# Patient Record
Sex: Female | Born: 1980 | Race: Black or African American | Hispanic: No | Marital: Single | Smoking: Former smoker
Health system: Southern US, Community
[De-identification: ages and names within clinical notes are randomized; demographics above are authoritative.]

## PROBLEM LIST (undated history)

## (undated) DIAGNOSIS — R55 Syncope and collapse: Secondary | ICD-10-CM

---

## 2014-03-30 ENCOUNTER — Emergency Department (HOSPITAL_COMMUNITY)
Admission: EM | Admit: 2014-03-30 | Discharge: 2014-03-30 | Disposition: A | Discharge status: Home / Self Care | Payer: Self-pay | Attending: Emergency Medicine | Admitting: Emergency Medicine

## 2014-03-30 ENCOUNTER — Encounter (HOSPITAL_COMMUNITY): Payer: Self-pay | Admitting: Emergency Medicine

## 2014-03-30 ENCOUNTER — Emergency Department (HOSPITAL_COMMUNITY): Payer: Self-pay

## 2014-03-30 DIAGNOSIS — R404 Transient alteration of awareness: Secondary | ICD-10-CM | POA: Insufficient documentation

## 2014-03-30 DIAGNOSIS — Z3202 Encounter for pregnancy test, result negative: Secondary | ICD-10-CM | POA: Insufficient documentation

## 2014-03-30 DIAGNOSIS — Z87891 Personal history of nicotine dependence: Secondary | ICD-10-CM | POA: Insufficient documentation

## 2014-03-30 DIAGNOSIS — R55 Syncope and collapse: Secondary | ICD-10-CM | POA: Insufficient documentation

## 2014-03-30 DIAGNOSIS — Z23 Encounter for immunization: Secondary | ICD-10-CM | POA: Insufficient documentation

## 2014-03-30 DIAGNOSIS — N390 Urinary tract infection, site not specified: Secondary | ICD-10-CM | POA: Insufficient documentation

## 2014-03-30 HISTORY — DX: Syncope and collapse: R55

## 2014-03-30 LAB — D-DIMER, QUANTITATIVE (NOT AT ARMC): D DIMER QUANT: 0.44 ug{FEU}/mL (ref 0.00–0.48)

## 2014-03-30 LAB — URINALYSIS, ROUTINE W REFLEX MICROSCOPIC
BILIRUBIN URINE: NEGATIVE
Glucose, UA: NEGATIVE mg/dL
Ketones, ur: NEGATIVE mg/dL
NITRITE: NEGATIVE
PH: 6 (ref 5.0–8.0)
Protein, ur: NEGATIVE mg/dL
SPECIFIC GRAVITY, URINE: 1.008 (ref 1.005–1.030)
UROBILINOGEN UA: 1 mg/dL (ref 0.0–1.0)

## 2014-03-30 LAB — I-STAT CHEM 8, ED
BUN: 9 mg/dL (ref 6–23)
CHLORIDE: 103 meq/L (ref 96–112)
Calcium, Ion: 1.12 mmol/L (ref 1.12–1.23)
Creatinine, Ser: 0.8 mg/dL (ref 0.50–1.10)
Glucose, Bld: 114 mg/dL — ABNORMAL HIGH (ref 70–99)
HEMATOCRIT: 36 % (ref 36.0–46.0)
Hemoglobin: 12.2 g/dL (ref 12.0–15.0)
POTASSIUM: 3.2 meq/L — AB (ref 3.7–5.3)
SODIUM: 137 meq/L (ref 137–147)
TCO2: 22 mmol/L (ref 0–100)

## 2014-03-30 LAB — URINE MICROSCOPIC-ADD ON

## 2014-03-30 LAB — TROPONIN I

## 2014-03-30 LAB — CBG MONITORING, ED: GLUCOSE-CAPILLARY: 92 mg/dL (ref 70–99)

## 2014-03-30 LAB — PREGNANCY, URINE: Preg Test, Ur: NEGATIVE

## 2014-03-30 MED ORDER — TETANUS-DIPHTH-ACELL PERTUSSIS 5-2.5-18.5 LF-MCG/0.5 IM SUSP
0.5000 mL | Freq: Once | INTRAMUSCULAR | Status: AC
  Administered 2014-03-30: 0.5 mL via INTRAMUSCULAR
  Filled 2014-03-30: qty 0.5

## 2014-03-30 MED ORDER — CEPHALEXIN 500 MG PO CAPS: 500.0000 mg | ORAL_CAPSULE | Freq: Four times a day (QID) | ORAL | Status: AC

## 2014-03-30 MED ORDER — SODIUM CHLORIDE 0.9 % IV BOLUS (SEPSIS)
1000.0000 mL | Freq: Once | INTRAVENOUS | Status: AC
  Administered 2014-03-30: 1000 mL via INTRAVENOUS

## 2014-03-30 MED ORDER — POTASSIUM CHLORIDE 20 MEQ/15ML (10%) PO LIQD
40.0000 meq | Freq: Once | ORAL | Status: AC
  Administered 2014-03-30: 40 meq via ORAL
  Filled 2014-03-30: qty 30

## 2014-03-30 MED ORDER — SODIUM CHLORIDE 0.9 % IV SOLN
INTRAVENOUS | Status: DC
  Administered 2014-03-30: 08:00:00 via INTRAVENOUS

## 2014-03-30 NOTE — Discharge Instructions (Signed)
°Emergency Department Resource Guide °1) Find a Doctor and Pay Out of Pocket °Although you won't have to find out who is covered by your insurance plan, it is a good idea to ask around and get recommendations. You will then need to call the office and see if the doctor you have chosen will accept you as a new patient and what types of options they offer for patients who are self-pay. Some doctors offer discounts or will set up payment plans for their patients who do not have insurance, but you will need to ask so you aren't surprised when you get to your appointment. ° °2) Contact Your Local Health Department °Not all health departments have doctors that can see patients for sick visits, but many do, so it is worth a call to see if yours does. If you don't know where your local health department is, you can check in your phone book. The CDC also has a tool to help you locate your state's health department, and many state websites also have listings of all of their local health departments. ° °3) Find a Walk-in Clinic °If your illness is not likely to be very severe or complicated, you may want to try a walk in clinic. These are popping up all over the country in pharmacies, drugstores, and shopping centers. They're usually staffed by nurse practitioners or physician assistants that have been trained to treat common illnesses and complaints. They're usually fairly quick and inexpensive. However, if you have serious medical issues or chronic medical problems, these are probably not your best option. ° °No Primary Care Doctor: °- Call [facility] at  832-8000 - they can help you locate a primary care doctor that  accepts your insurance, provides certain services, etc. °- Physician Referral Service- 1-800-533-3463 ° °Chronic Pain Problems: °Organization         Address  Phone   Notes  °[facility]  (336) [phone] Patients need to be referred by their primary care doctor.  ° °Medication  Assistance: °Organization         Address  Phone   Notes  °[place] [facility] [address] °[place], [place] [address] (336) 641-8030 --Must be a resident of [place] °-- Must have NO insurance coverage whatsoever (no Medicaid/ Medicare, etc.) °-- The pt. MUST have a primary care doctor that directs their care regularly and follows them in the community °  °[facility]  (866) 331-1348   °[employer]  (888) 892-1162   ° °Agencies that provide inexpensive medical care: °Organization         Address  Phone   Notes  °[facility]  (336) 832-8035   °[facility]    (336) 832-7272   °[facility] [address] °[place], [place] [address] (336) 832-4777   °[facility] [address], °[place] (336) 271-4999   °[facility]    (336) 373-0678   °[facility]    (336) 272-1050   °[facility] ° [address], [place] Phone:  (336) 832-4444, Fax:  (336) 832-4440 Hours of Operation:  9 am - 6 pm, M-F.  Also accepts Medicaid/Medicare and self-pay.  °[facility] ° [address], [place] Phone: (336) [phone], Fax: (336) [phone]. Hours of Operation:  8:30 am - 5:30 pm, M-F.  Also accepts Medicaid and self-pay.  °[facility] [place] [address], [place] Phone: (336) 878-6027   °[facility] [address], [place], [place] (336)723-1848, Ext. 123 Mondays & Thursdays: 7-9 AM.  First 15 patients are seen on a first come, first serve basis. °  ° °Medicaid-accepting [place] Providers: ° °Organization         Address  Phone   Notes  °[facility] [address], [place] (336) 641-2100 Also accepts self-pay patients.  °[facility] [address], [place] ° (336) 856-9996   °[facility] [address], [place]  (336) 288-8857   °[facility] [address] [place] [address], [place] (336) 299-7000   °Veita Bland [address], [place]  ° (336) 373-1557 Only accepts [place] Access Medicaid patients after they have their name applied to their card.  ° °Self-Pay (no insurance) in [place]: ° °Organization         Address  Phone   Notes  °Sickle Cell Patients, [facility] [address], [place] (336) 832-1970   °[facility] [address], [place] (336) 832-4400   °[facility] ° [address], [place] (336) 992-4800   °[facility]/Dr. Osei-Bonsu ° [address] [place] [address], [place] or [address], [place] (336) [phone] Phone number for both [place] and [place] locations is the same.  °[facility] [address], [place] (336) 299-0000   °[facility] [address] [place] [address], [place] or [address] (336) 852-7530 °(336) 878-2260   °[facility] [address], [place] (336) 350-1642, phone; (336) 294-5005, fax Sees patients 1st and 3rd Saturday of every month.  Must not qualify for public or private insurance (i.e. Medicaid, Medicare, [place] Health Choice, Veterans' Benefits) • Household income should be no more than 200% of the poverty level •The clinic cannot treat you if you are pregnant or think you are pregnant • Sexually transmitted diseases are not treated at the clinic.  ° ° °Dental Care: °Organization         Address  Phone  Notes  °[place] [facility] [address], [place] (336) 641-6152 Accepts children up to age 21 who are enrolled in Medicaid or [place] Health Choice; pregnant women with a Medicaid card; and children who have applied for Medicaid or [place] Health Choice, but were declined, whose parents can pay a reduced fee at time of service.  °[place]  [facility] [place]  [address], [place] (336) 641-7733 Accepts children up to age 21 who are enrolled in Medicaid or [place] Health Choice; pregnant women with a Medicaid card; and children who have applied for Medicaid or [place] Health Choice, but were declined, whose parents can pay a reduced fee at time of service.  °[facility] ° [address], [place] (336) 641-4533 Patients are seen by appointment only. Walk-ins are not accepted. [facility] will see patients 18 years of age and older. °Monday - Tuesday (8am-5pm) °Most Wednesdays (8:30-5pm) °$30 per visit, cash only  °[facility] ° [address], [place] (336) 641-4533 Patients are seen by appointment only. Walk-ins are not accepted. [facility] will see patients 18 years of age and older. °One   Wednesday Evening (Monthly: Volunteer Based).  $30 per visit, cash only  °[facility]  (919) [phone] for adults; Children under age 4, call Graduate Pediatric Dentistry at ([phone]) [phone]. Children aged 4-14, please call ([phone]) [phone] to request a pediatric application. ° Dental services are provided in all areas of dental care including fillings, crowns and bridges, complete and partial dentures, implants, gum treatment, root canals, and extractions. Preventive care is also provided. Treatment is provided to both adults and children. °Patients are selected via a lottery and there is often a waiting list. °  °[facility] [address], °[place] ° (336) 763-8833 [email or web address] °  °[facility] [address], [place], [place] (336)723-1848, Ext. 123 Second and Fourth Thursday of each month, opens at 6:30 AM; Clinic ends at 9 AM.  Patients are seen on a first-come first-served basis, and a limited number are seen during each clinic.  ° °[facility] ° [address], [place], [place] (336) 723-7904    Eligibility Requirements °You must have lived in [place], [place], or [place] counties for at least the last three months. °  You cannot be eligible for state or federal sponsored healthcare insurance, including Veterans Administration, Medicaid, or Medicare. °  You generally cannot be eligible for healthcare insurance through your employer.  °  How to apply: °Eligibility screenings are held every Tuesday and Wednesday afternoon from 1:00 pm until 4:00 pm. You do not need an appointment for the interview!  °[facility] [address], [place], [place] 336-631-2330   °[facility]  336-342-8273   °[facility]  336-703-3100   °[facility]  336-570-6415   ° °Behavioral Health Resources in the Community: °Intensive Outpatient Programs °Organization         Address  Phone  Notes  °[facility] [address], [place], [place] 336-878-6098   °[facility] [address], [place], [place] 336-832-9800   °[facility] [address], [place], [place] ° 336-882-2125   °[facility] [address],  °[place], [place] 1-800-853-5163 or 336-641-4981   °Substance Abuse Resources °Organization         Address  Phone  Notes  °[facility]  336-882-2125   °[facility]  336-784-9470   °[facility]  336-285-9073   °[facility]  336-845-3988   °Residential & Outpatient Substance Abuse Program  1-800-659-3381   °Psychological Services °Organization         Address  Phone  Notes  °[facility]  336- 832-9600   °[facility]  336- 378-7881   °[facility] [address], [place] 1-800-853-5163 or 336-641-4981   ° °Mobile Crisis Teams °Organization         Address  Phone  Notes  °[facility], Mobile Crisis Care Unit  1-877-626-1772   °Assertive °[facility] ° [address]  [place], [place] 336-834-9664   °Sharon DeEsch [address] °[place] 336-554-5454   ° °Self-Help/Support Groups °Organization         Address  Phone             Notes  °[facility]  - variety of support groups  336- 373-1402 Call for more information  °Narcotics Anonymous (NA), [facility] [address], °[place]  2 meetings at this location  ° °  Residential Treatment Programs Organization         Address  Phone  Notes  [facility] [address],    [place]  [phone]   [facility]  [address], [place] [phone], [place], [place] [phone]   [facility] [address], [place] [phone] Admissions: 8am-3pm M-F  [facility] [address]    [place], [place] [phone]   [facility] [address], [place], [place] [phone]   [facility] [address]  [place], [place] [phone]   [facility] Intensive Outpatient [address], [place], [place] [phone]   [facility] ([facility] [address]  [place], [place] [phone] or [phone]   [facility] ([facility] [address] [place], [place] [phone] Accepts Medicaid  [facility] [address]  [place] [phone] Substance Abuse/Addiction Treatment   [facility]         Address  Phone  Notes  [facility]  (204)392-9440(888) [phone])[phone]   Angie FavaJulie Brannon, PhD [address], [address] [place], [place]   [phone](336) [phone] or (913) [phone](336) [phone]   [facility]   [address] [place], [place] [phone](336) [phone]   [facility] [address], [place], [place] [phone](336) [phone] Insurance/Medicaid/sponsorship through [facility] [address]                                    [place], [place] [phone](336) [phone] Therapy/tele-psych/case    [facility] [address]   [place], [place] [phone](336) [phone]    Dr. Lolly MustacheArfeen  [phone](336) [phone]   [facility]  [employer] [facility] 1) [address], [place] 2) [address], [place] 3)  [address], [place] [phone](336) [phone](336) [phone]  [phone](336) [phone]   [facility] (779)013-7384(336) 407-431-2545 or [phone](336) [phone] (After Hours)      Take the prescription as directed.  Increase your fluid intake (ie: Gatorade) for the next several days. Call your regular medical doctor today to schedule a follow up appointment within the next 2 days.  Return to the Emergency Department immediately sooner if worsening.

## 2014-03-30 NOTE — ED Notes (Signed)
[place] and [place], witnessed syncopal episode and pt fell to the ground but unknown duration of time of LOC. Upon EMS arrival pt was A&Ox4 and pt has chipped tooth and abrasion to lower lip, denies neck or back pain. EKG NSR, CBG-128, BP-80/50 initially and then 96/52 pt refused IV en route. Pt continues to endorse dizziness.

## 2014-03-30 NOTE — ED Notes (Signed)
Patient transported to X-ray 

## 2014-03-30 NOTE — ED Provider Notes (Signed)
CSN: [id number]     Arrival date & time 03/30/14  [id number] History   First MD Initiated Contact with Patient 03/30/14 [phone]     Chief Complaint  Patient presents with  . Loss of Consciousness      HPI Pt was seen at 0720. Per EMS and pt, c/o sudden onset and resolution of one episode of syncope that occurred PTA. Pt states she was changing buses when she felt "lightheaded" and "then I was on the ground." Pt endorses hx of same "for years" but has not been evaluated by a medical provider. EMS states pt was "80/50," and pt refused IV placement en route to the ED. Denies CP/palpitations, no SOB/cough, no abd pain, no N/V/D, no neck or back pain, no focal motor weakness, no tingling/numbness in extremities, no reported seizure activity, no incont of bowel/bladder.    Unk Td Past Medical History  Diagnosis Date  . Syncope     recurrent    History reviewed. No pertinent past surgical history.  History  Substance Use Topics  . Smoking status: Former Smoker    Quit date: 03/30/2010  . Smokeless tobacco: Not on file  . Alcohol Use: Yes    Review of Systems ROS: Statement: All systems negative except as marked or noted in the HPI; Constitutional: Negative for fever and chills. ; ; Eyes: Negative for eye pain, redness and discharge. ; ; ENMT: Negative for ear pain, hoarseness, nasal congestion, sinus pressure and sore throat. ; ; Cardiovascular: Negative for chest pain, palpitations, diaphoresis, dyspnea and peripheral edema. ; ; Respiratory: Negative for cough, wheezing and stridor. ; ; Gastrointestinal: Negative for nausea, vomiting, diarrhea, abdominal pain, blood in stool, hematemesis, jaundice and rectal bleeding. . ; ; Genitourinary: Negative for dysuria, flank pain and hematuria. ; ; Musculoskeletal: Negative for back pain and neck pain. Negative for swelling and deformity.; ; Skin: +abrasion. Negative for pruritus, rash, blisters, bruising and skin lesion.; ; Neuro: Negative for headache and  neck stiffness. Negative for weakness, altered level of consciousness , altered mental status, extremity weakness, paresthesias, involuntary movement, seizure and +lightheadedness, +syncope.      Allergies  Review of patient's allergies indicates no known allergies.  Home Medications   Prior to Admission medications   Medication Sig Start Date End Date Taking? Authorizing Provider  ibuprofen (ADVIL,MOTRIN) 200 MG tablet Take 400 mg by mouth every 6 (six) hours as needed (pain).   Yes Historical Provider, MD   BP 120/61  Pulse 84  Temp(Src) 98.4 F (36.9 C) (Oral)  Resp 20  SpO2 100%  LMP 03/23/2014 Physical Exam 0725: Physical examination: Vital signs and O2 SAT: Reviewed; Constitutional: Well developed, Well nourished, Well hydrated, In no acute distress; Head and Face: Normocephalic, No scalp hematomas, no lacs.  Non-tender to palp superior and inferior orbital rim areas.  No zygoma tenderness.  No mandibular tenderness.; Eyes: EOMI, PERRL, No scleral icterus; ENMT: Mouth and pharynx normal, Left TM normal, Right TM normal, Mucous membranes moist, +right lower lateral outer lip with localized edema and superficial abrasion. No deep lac, no inner lip lac, no visualized or palp FB. +right upper lateral incisor with small chip. Otherwise, teeth and tongue intact.  No intraoral or intranasal bleeding.  No septal hematomas.  No trismus, no malocclusion.; Neck: Supple, Trachea midline; Spine: No midline CS, TS, LS tenderness. No abrasions or ecchymosis.; Cardiovascular: Regular rate and rhythm, No murmur, rub, or gallop; Respiratory: Breath sounds clear & equal bilaterally, No rales, rhonchi, wheezes, Normal respiratory  effort/excursion; Chest: Nontender, No deformity, Movement normal, No crepitus, No abrasions or ecchymosis.; Abdomen: Soft, Nontender, Nondistended, Normal bowel sounds, No abrasions or ecchymosis.; Genitourinary: No CVA tenderness;; Extremities: No deformity, Full range of motion  major/large joints of bilat UE's and LE's without pain or tenderness to palp, Neurovascularly intact, Pulses normal, No tenderness, No edema, Pelvis stable; Neuro: AA&Ox3, GCS 15.  Major CN grossly intact. Speech clear. No gross focal motor or sensory deficits in extremities.; Skin: Color normal, Warm, Dry   ED Course  Procedures   0730:  Pt refused IV placement for EMS. Pt orthostatic on ED arrival. She is now agreeable to IV placement. Workup ordered. Td updated.  1115:  Pt has tol PO well while in the ED without N/V.  No stooling while in the ED. Pt has ambulated with steady gait, easy resps, NAD. Denies further symptoms of lightheadedness/syncope. Neuro exam remains intact/unchanged. VS improved after IVF. Feels better and wants to go home now. Potassium repleted PO. Will tx for UTI. Dx and testing d/w pt.  Questions answered.  Verb understanding, agreeable to d/c home with outpt f/u.        EKG Interpretation   Date/Time:  Wednesday March 30 2014 06:51:34 EDT Ventricular Rate:  77 PR Interval:  130 QRS Duration: 74 QT Interval:  402 QTC Calculation: 455 R Axis:   52 Text Interpretation:  Normal sinus rhythm Normal ECG No old tracing to  compare Confirmed by [facility]  MD, Nicholos Johns [phone]) on 03/30/2014 8:17:34  AM      MDM  MDM Reviewed: nursing note and vitals Interpretation: labs, ECG, x-ray and CT scan     Results for orders placed during the hospital encounter of 03/30/14  URINALYSIS, ROUTINE W REFLEX MICROSCOPIC      Result Value Ref Range   Color, Urine YELLOW  YELLOW   APPearance CLOUDY (*) CLEAR   Specific Gravity, Urine 1.008  1.005 - 1.030   pH 6.0  5.0 - 8.0   Glucose, UA NEGATIVE  NEGATIVE mg/dL   Hgb urine dipstick LARGE (*) NEGATIVE   Bilirubin Urine NEGATIVE  NEGATIVE   Ketones, ur NEGATIVE  NEGATIVE mg/dL   Protein, ur NEGATIVE  NEGATIVE mg/dL   Urobilinogen, UA 1.0  0.0 - 1.0 mg/dL   Nitrite NEGATIVE  NEGATIVE   Leukocytes, UA MODERATE (*)  NEGATIVE  PREGNANCY, URINE      Result Value Ref Range   Preg Test, Ur NEGATIVE  NEGATIVE  TROPONIN I      Result Value Ref Range   Troponin I <0.30  <0.30 ng/mL  D-DIMER, QUANTITATIVE      Result Value Ref Range   D-Dimer, Quant 0.44  0.00 - 0.48 ug/mL-FEU  URINE MICROSCOPIC-ADD ON      Result Value Ref Range   Squamous Epithelial / LPF FEW (*) RARE   WBC, UA 21-50  <3 WBC/hpf   RBC / HPF 11-20  <3 RBC/hpf   Bacteria, UA FEW (*) RARE   Casts HYALINE CASTS (*) NEGATIVE  CBG MONITORING, ED      Result Value Ref Range   Glucose-Capillary 92  70 - 99 mg/dL   Comment 1 Notify RN    I-STAT CHEM 8, ED      Result Value Ref Range   Sodium 137  137 - 147 mEq/L   Potassium 3.2 (*) 3.7 - 5.3 mEq/L   Chloride 103  96 - 112 mEq/L   BUN 9  6 - 23 mg/dL  Creatinine, Ser 0.80  0.50 - 1.10 mg/dL   Glucose, Bld 161 (*) 70 - 99 mg/dL   Calcium, Ion 0.96  0.45 - 1.23 mmol/L   TCO2 22  0 - 100 mmol/L   Hemoglobin 12.2  12.0 - 15.0 g/dL   HCT 40.9  81.1 - 91.4 %   Dg Chest 2 View 03/30/2014   CLINICAL DATA:  Syncopal episode.  Dizziness.  EXAM: CHEST  2 VIEW  COMPARISON:  None.  FINDINGS: Heart size is normal. Mediastinal shadows are normal. The lungs are clear. The vascularity is normal. No effusions. No bony abnormalities.  IMPRESSION: Normal chest radiography   Electronically Signed   By: Paulina Fusi M.D.   On: 03/30/2014 08:06   Ct Head Wo Contrast 03/30/2014   CLINICAL DATA:  Dizzy and lightheaded.  Syncopal episode.  EXAM: CT HEAD WITHOUT CONTRAST  TECHNIQUE: Contiguous axial images were obtained from the base of the skull through the vertex without intravenous contrast.  COMPARISON:  None.  FINDINGS: The brain has a normal appearance without evidence of atrophy, infarction, mass lesion, hemorrhage, hydrocephalus or extra-axial collection. The calvarium is unremarkable. The paranasal sinuses, middle ears and mastoids are clear.  IMPRESSION: Normal head CT   Electronically Signed   By: Paulina Fusi M.D.   On: 03/30/2014 08:06      Samuel Jester, DO 03/31/14 1730

## 2014-03-30 NOTE — ED Notes (Signed)
Patient transported to CT 

## 2015-01-07 IMAGING — CT CT HEAD W/O CM
1 series · 16 of 30 positions shown, 20 images · non-contrast
Comparison: None.

CLINICAL DATA: Dizzy and lightheaded.  Syncopal episode.

EXAM:
CT HEAD WITHOUT CONTRAST
TECHNIQUE: Contiguous axial images were obtained from the base of the skull
through the vertex without intravenous contrast.

[Series 2: head 5.0 h30s · axial · 0.41mm/px · z∈[-159,-19]mm · 16 of 32 slices shown, 20 images]
[im 2/32  brain]
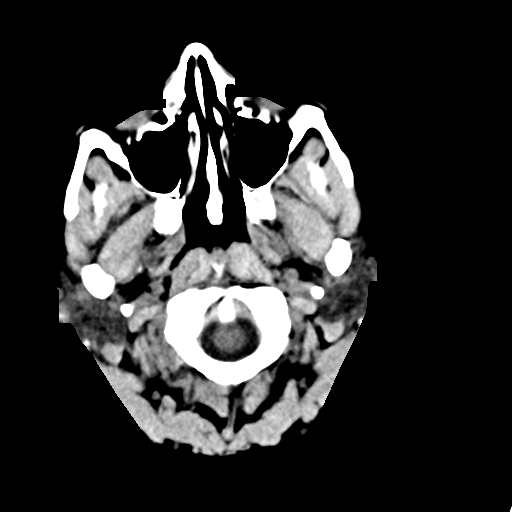
[im 2/32  bone]
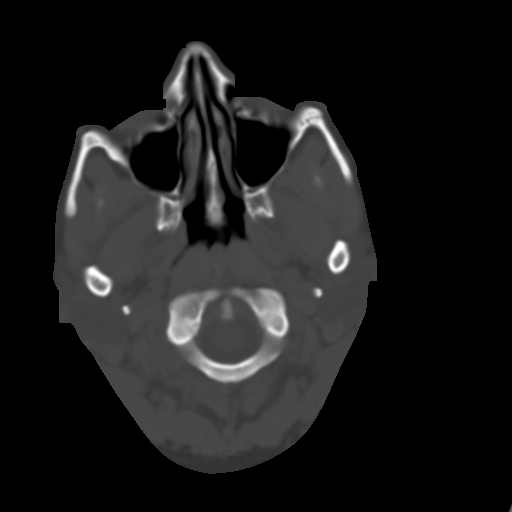
[im 4/32  brain]
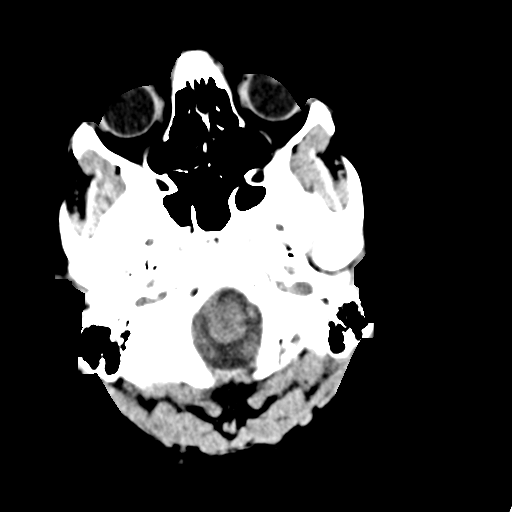
[im 6/32  brain]
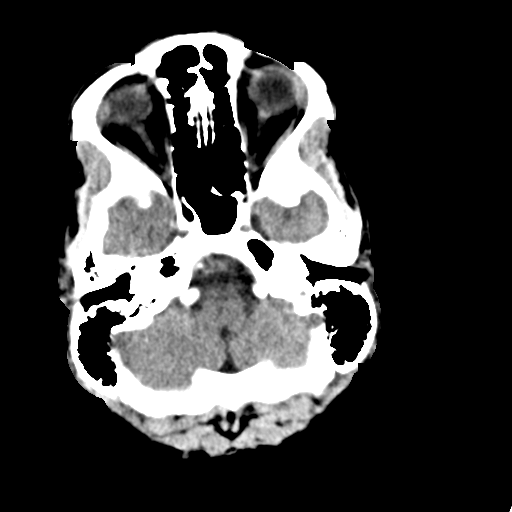
[im 8/32  brain]
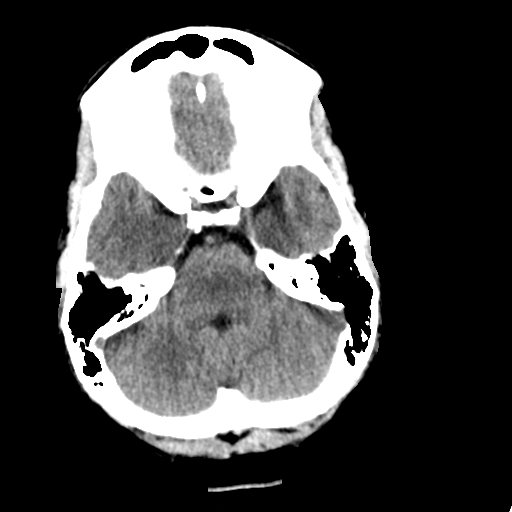
[im 9/32  brain]
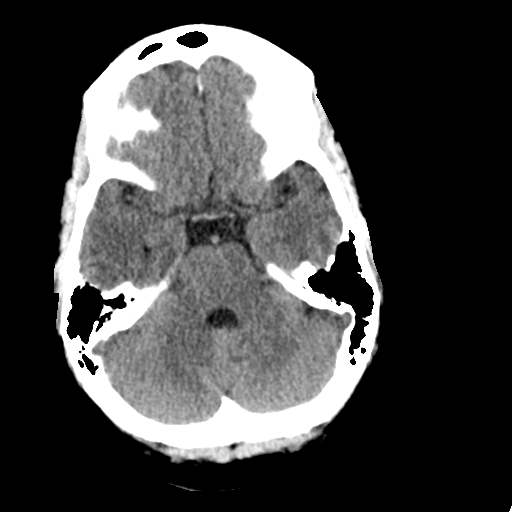
[im 9/32  bone]
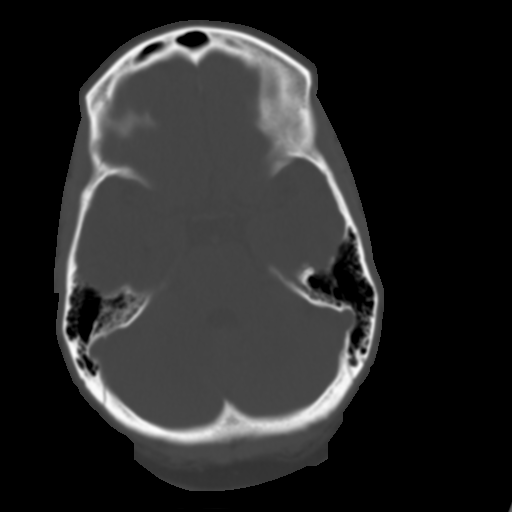
[im 11/32  brain]
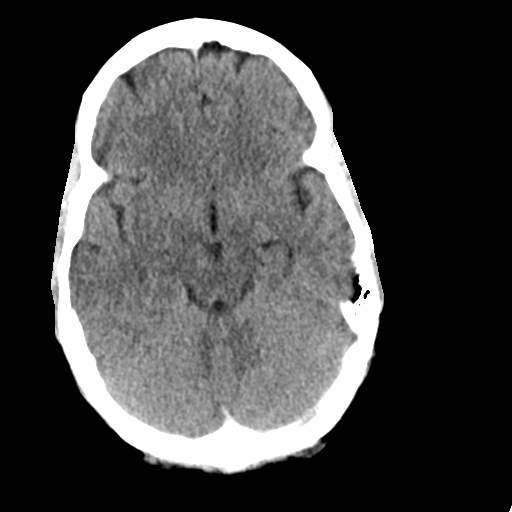
[im 13/32  brain]
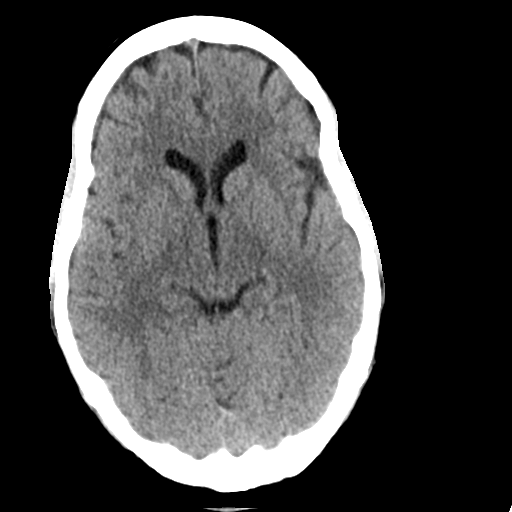
[im 15/32  brain]
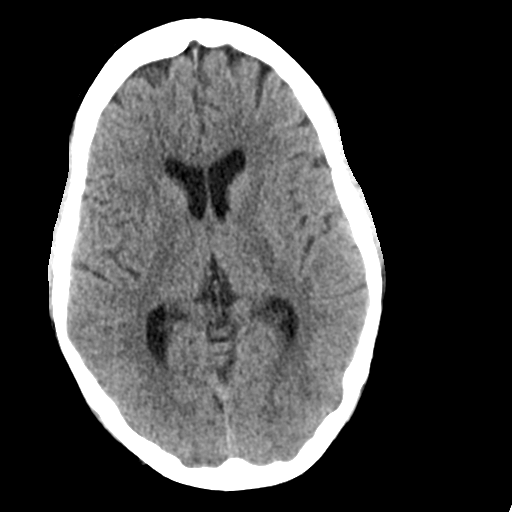
[im 17/32  brain]
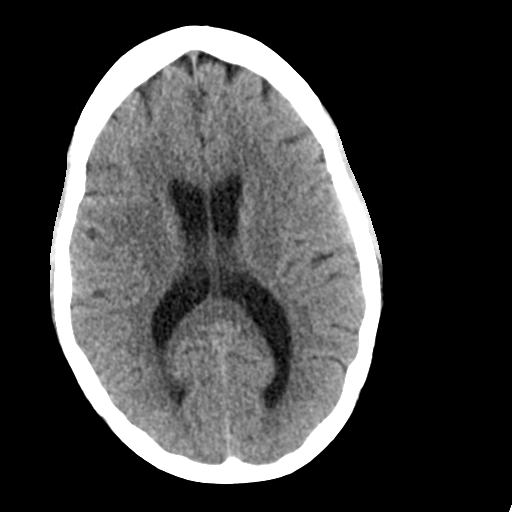
[im 17/32  bone]
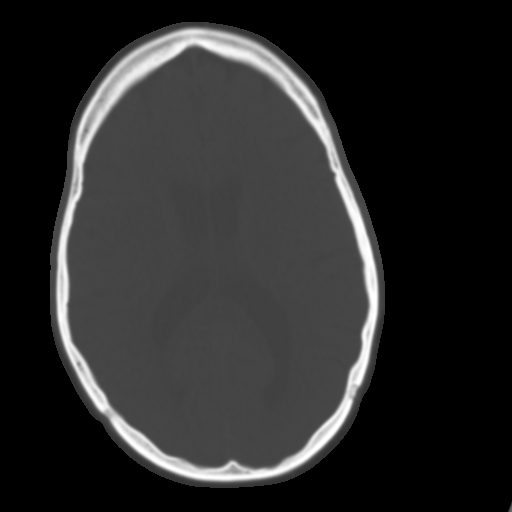
[im 19/32  brain]
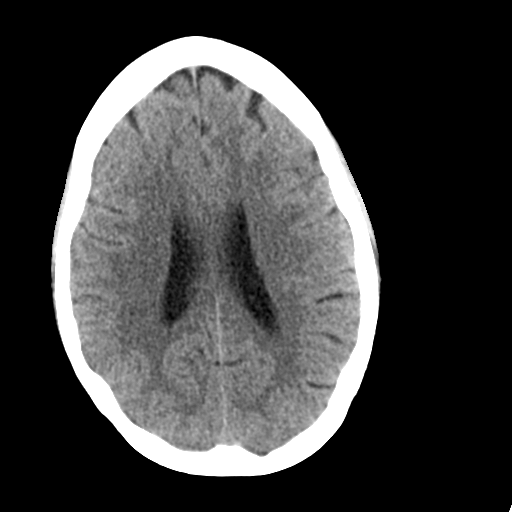
[im 21/32  brain]
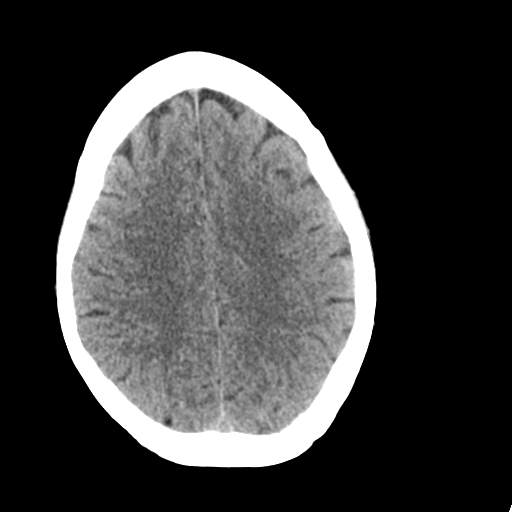
[im 23/32  brain]
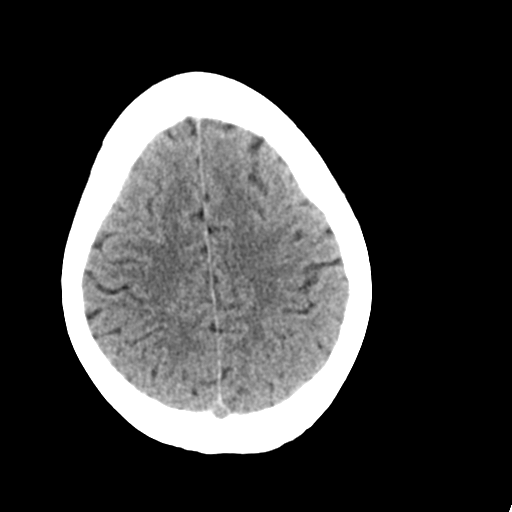
[im 24/32  brain]
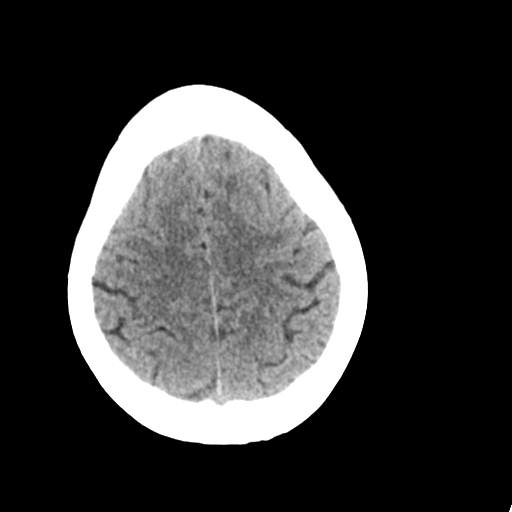
[im 24/32  bone]
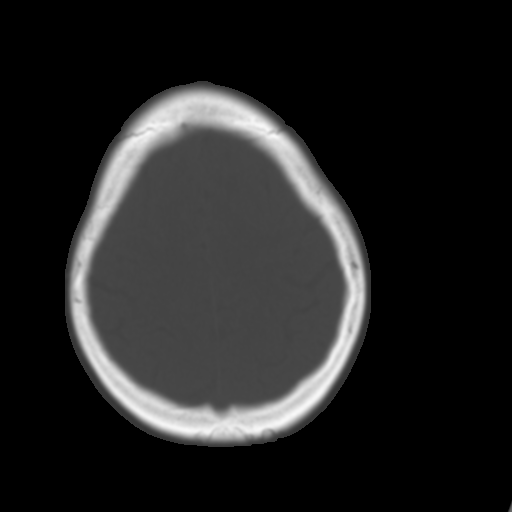
[im 26/32  brain]
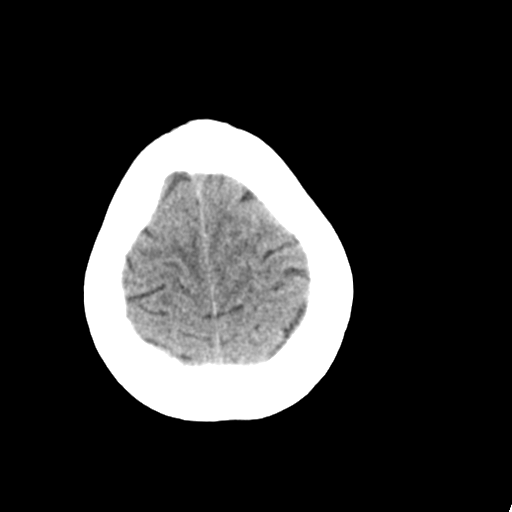
[im 28/32  brain]
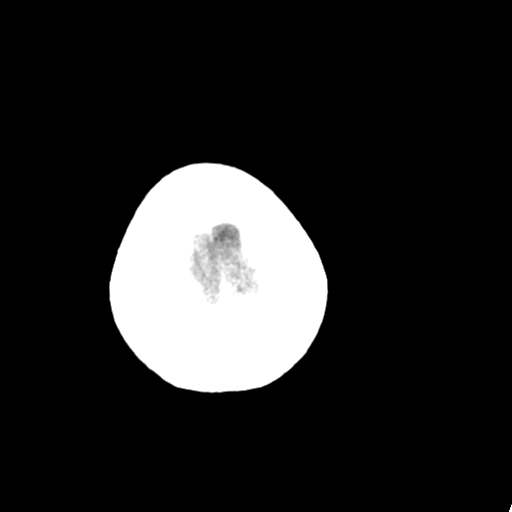
[im 30/32  brain]
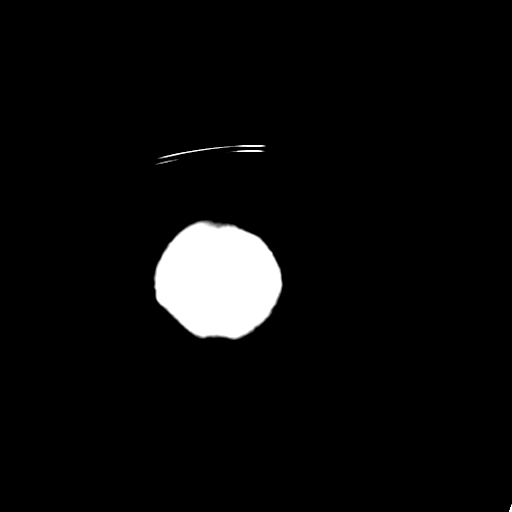

[16 of 30 positions shown; findings below may reference images not displayed]

FINDINGS: The brain has a normal appearance without evidence of atrophy,
infarction, mass lesion, hemorrhage, hydrocephalus or extra-axial
collection. The calvarium is unremarkable. The paranasal sinuses,
middle ears and mastoids are clear.
IMPRESSION: Normal head CT
